# Patient Record
Sex: Male | Born: 1975 | Race: Black or African American | Hispanic: No | Marital: Single
Health system: Southern US, Community
[De-identification: ages and names within clinical notes are randomized; demographics above are authoritative.]

---

## 1997-07-24 ENCOUNTER — Emergency Department (HOSPITAL_COMMUNITY): Admission: EM | Admit: 1997-07-24 | Discharge: 1997-07-24 | Payer: Self-pay | Admitting: Emergency Medicine

## 1997-07-27 ENCOUNTER — Emergency Department (HOSPITAL_COMMUNITY): Admission: EM | Admit: 1997-07-27 | Discharge: 1997-07-27 | Payer: Self-pay | Admitting: Emergency Medicine

## 1998-04-08 ENCOUNTER — Emergency Department (HOSPITAL_COMMUNITY): Admission: EM | Admit: 1998-04-08 | Discharge: 1998-04-08 | Payer: Self-pay

## 1998-04-10 ENCOUNTER — Emergency Department (HOSPITAL_COMMUNITY): Admission: EM | Admit: 1998-04-10 | Discharge: 1998-04-10 | Payer: Self-pay | Admitting: Emergency Medicine

## 1998-09-14 ENCOUNTER — Emergency Department (HOSPITAL_COMMUNITY): Admission: EM | Admit: 1998-09-14 | Discharge: 1998-09-14 | Payer: Self-pay | Admitting: Emergency Medicine

## 1998-09-17 ENCOUNTER — Emergency Department (HOSPITAL_COMMUNITY): Admission: EM | Admit: 1998-09-17 | Discharge: 1998-09-17 | Payer: Self-pay | Admitting: *Deleted

## 2006-05-30 ENCOUNTER — Emergency Department (HOSPITAL_COMMUNITY): Admission: EM | Admit: 2006-05-30 | Discharge: 2006-05-30 | Payer: Self-pay | Admitting: Emergency Medicine

## 2006-06-16 ENCOUNTER — Emergency Department (HOSPITAL_COMMUNITY): Admission: EM | Admit: 2006-06-16 | Discharge: 2006-06-16 | Payer: Self-pay | Admitting: Emergency Medicine

## 2008-06-01 ENCOUNTER — Emergency Department (HOSPITAL_COMMUNITY): Admission: EM | Admit: 2008-06-01 | Discharge: 2008-06-01 | Payer: Self-pay | Admitting: *Deleted

## 2008-06-05 ENCOUNTER — Emergency Department (HOSPITAL_COMMUNITY): Admission: EM | Admit: 2008-06-05 | Discharge: 2008-06-05 | Payer: Self-pay | Admitting: Emergency Medicine

## 2009-11-05 ENCOUNTER — Emergency Department (HOSPITAL_COMMUNITY): Admission: EM | Admit: 2009-11-05 | Discharge: 2009-11-05 | Payer: Self-pay | Admitting: Emergency Medicine

## 2018-05-21 ENCOUNTER — Other Ambulatory Visit: Payer: Self-pay

## 2018-05-21 ENCOUNTER — Emergency Department (HOSPITAL_COMMUNITY)
Admission: EM | Admit: 2018-05-21 | Discharge: 2018-05-22 | Disposition: A | Discharge status: Home / Self Care | Payer: Self-pay | Attending: Emergency Medicine | Admitting: Emergency Medicine

## 2018-05-21 DIAGNOSIS — S0101XA Laceration without foreign body of scalp, initial encounter: Secondary | ICD-10-CM

## 2018-05-21 DIAGNOSIS — Y929 Unspecified place or not applicable: Secondary | ICD-10-CM | POA: Insufficient documentation

## 2018-05-21 DIAGNOSIS — N289 Disorder of kidney and ureter, unspecified: Secondary | ICD-10-CM

## 2018-05-21 DIAGNOSIS — F10920 Alcohol use, unspecified with intoxication, uncomplicated: Secondary | ICD-10-CM

## 2018-05-21 DIAGNOSIS — Y999 Unspecified external cause status: Secondary | ICD-10-CM | POA: Insufficient documentation

## 2018-05-21 DIAGNOSIS — Y939 Activity, unspecified: Secondary | ICD-10-CM | POA: Insufficient documentation

## 2018-05-21 DIAGNOSIS — Y908 Blood alcohol level of 240 mg/100 ml or more: Secondary | ICD-10-CM | POA: Insufficient documentation

## 2018-05-21 DIAGNOSIS — Z23 Encounter for immunization: Secondary | ICD-10-CM | POA: Insufficient documentation
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
## 2018-05-22 ENCOUNTER — Emergency Department (HOSPITAL_COMMUNITY): Payer: Self-pay

## 2018-05-22 LAB — COMPREHENSIVE METABOLIC PANEL
ALT: 14 U/L (ref 0–44)
AST: 19 U/L (ref 15–41)
Albumin: 3.9 g/dL (ref 3.5–5.0)
Alkaline Phosphatase: 43 U/L (ref 38–126)
Anion gap: 13 (ref 5–15)
BUN: 9 mg/dL (ref 6–20)
CO2: 22 mmol/L (ref 22–32)
Calcium: 9 mg/dL (ref 8.9–10.3)
Chloride: 105 mmol/L (ref 98–111)
Creatinine, Ser: 1.33 mg/dL — ABNORMAL HIGH (ref 0.61–1.24)
GFR calc Af Amer: >60 mL/min (ref >60)
GFR calc non Af Amer: >60 mL/min (ref >60)
Glucose, Bld: 87 mg/dL (ref 70–99)
Potassium: 4.1 mmol/L (ref 3.5–5.1)
Sodium: 140 mmol/L (ref 135–145)
Total Bilirubin: 0.5 mg/dL (ref 0.3–1.2)
Total Protein: 7.2 g/dL (ref 6.5–8.1)

## 2018-05-22 LAB — CBC WITH DIFFERENTIAL/PLATELET
Abs Immature Granulocytes: 0.03 10*3/uL (ref 0.00–0.07)
Basophils Absolute: 0 10*3/uL (ref 0.0–0.1)
Basophils Relative: 1 %
Eosinophils Absolute: 0.1 10*3/uL (ref 0.0–0.5)
Eosinophils Relative: 2 %
HCT: 45.1 % (ref 39.0–52.0)
Hemoglobin: 14.5 g/dL (ref 13.0–17.0)
Immature Granulocytes: 1 %
Lymphocytes Relative: 42 %
Lymphs Abs: 2.8 10*3/uL (ref 0.7–4.0)
MCH: 28 pg (ref 26.0–34.0)
MCHC: 32.2 g/dL (ref 30.0–36.0)
MCV: 87.1 fL (ref 80.0–100.0)
Monocytes Absolute: 0.4 10*3/uL (ref 0.1–1.0)
Monocytes Relative: 6 %
Neutro Abs: 3.3 10*3/uL (ref 1.7–7.7)
Neutrophils Relative %: 48 %
Platelets: 280 10*3/uL (ref 150–400)
RBC: 5.18 MIL/uL (ref 4.22–5.81)
RDW: 14.2 % (ref 11.5–15.5)
WBC: 6.5 10*3/uL (ref 4.0–10.5)
nRBC: 0 % (ref 0.0–0.2)

## 2018-05-22 LAB — ETHANOL: Alcohol, Ethyl (B): 264 mg/dL — ABNORMAL HIGH (ref <10)

## 2018-05-22 MED ORDER — TETANUS-DIPHTH-ACELL PERTUSSIS 5-2.5-18.5 LF-MCG/0.5 IM SUSP
0.5000 mL | Freq: Once | INTRAMUSCULAR | Status: AC
  Administered 2018-05-22: 0.5 mL via INTRAMUSCULAR
  Filled 2018-05-22: qty 0.5
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
## 2018-06-02 ENCOUNTER — Emergency Department (HOSPITAL_COMMUNITY)
Admission: EM | Admit: 2018-06-02 | Discharge: 2018-06-02 | Disposition: A | Discharge status: Home / Self Care | Payer: Self-pay | Attending: Emergency Medicine | Admitting: Emergency Medicine

## 2018-06-02 ENCOUNTER — Other Ambulatory Visit: Payer: Self-pay

## 2018-06-02 ENCOUNTER — Encounter (HOSPITAL_COMMUNITY): Payer: Self-pay | Admitting: Emergency Medicine

## 2018-06-02 DIAGNOSIS — Z4802 Encounter for removal of sutures: Secondary | ICD-10-CM | POA: Insufficient documentation

## 2018-06-02 NOTE — ED Triage Notes (Signed)
Pt here to have staples in head removed.
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
## 2018-06-02 NOTE — Discharge Instructions (Addendum)
Followup with your doctor as needed
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
